# Patient Record
Sex: Male | Born: 1940 | Race: White | Hispanic: No | Marital: Married | Smoking: Former smoker
Health system: Southern US, Community
[De-identification: ages and names within clinical notes are randomized; demographics above are authoritative.]

---

## 1998-11-06 ENCOUNTER — Emergency Department (HOSPITAL_COMMUNITY): Admission: EM | Admit: 1998-11-06 | Discharge: 1998-11-07 | Payer: Self-pay | Admitting: Emergency Medicine

## 1998-11-07 ENCOUNTER — Encounter: Payer: Self-pay | Admitting: Emergency Medicine

## 1999-01-05 ENCOUNTER — Emergency Department (HOSPITAL_COMMUNITY): Admission: EM | Admit: 1999-01-05 | Discharge: 1999-01-05 | Payer: Self-pay | Admitting: Emergency Medicine

## 2005-04-22 ENCOUNTER — Emergency Department (HOSPITAL_COMMUNITY): Admission: EM | Admit: 2005-04-22 | Discharge: 2005-04-22 | Payer: Self-pay | Admitting: Emergency Medicine

## 2005-11-19 ENCOUNTER — Emergency Department (HOSPITAL_COMMUNITY): Admission: EM | Admit: 2005-11-19 | Discharge: 2005-11-19 | Payer: Self-pay | Admitting: Emergency Medicine

## 2007-01-28 IMAGING — CT CT HEAD W/O CM
1 series · 16 of 30 positions shown, 20 images · non-contrast
Comparison: none

HISTORY: Headache, fell off ladder striking right posterior head

[Series 2: head_seq 4.5 h45s st · axial · 0.43mm/px · z∈[+15,+159]mm · 16 of 36 slices shown, 20 images]
[im 2/36  brain]
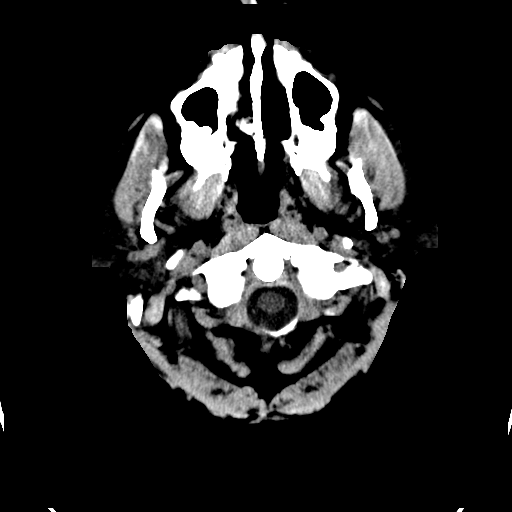
[im 2/36  bone]
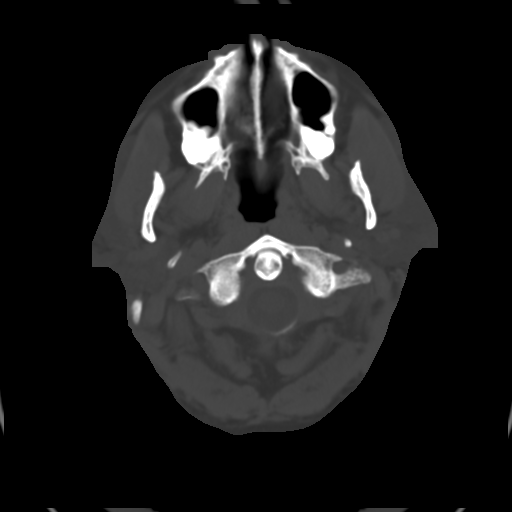
[im 4/36  brain]
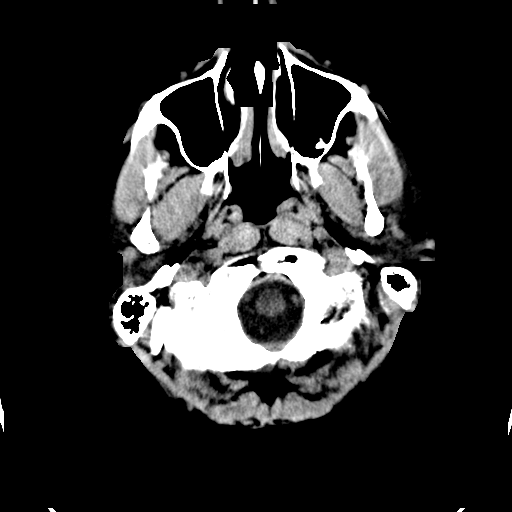
[im 7/36  brain]
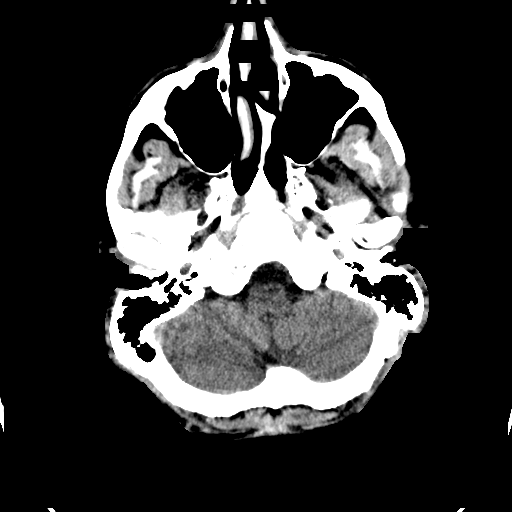
[im 9/36  brain]
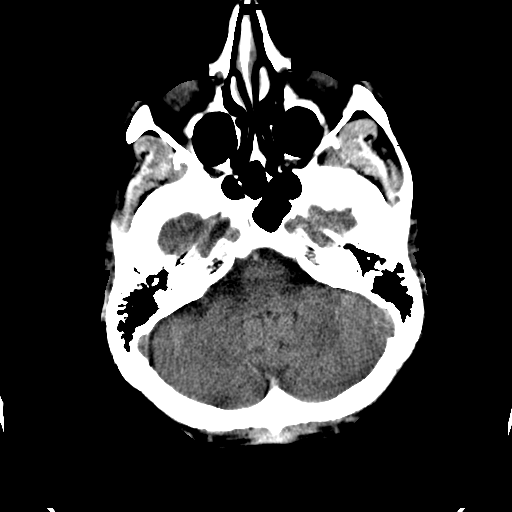
[im 10/36  brain]
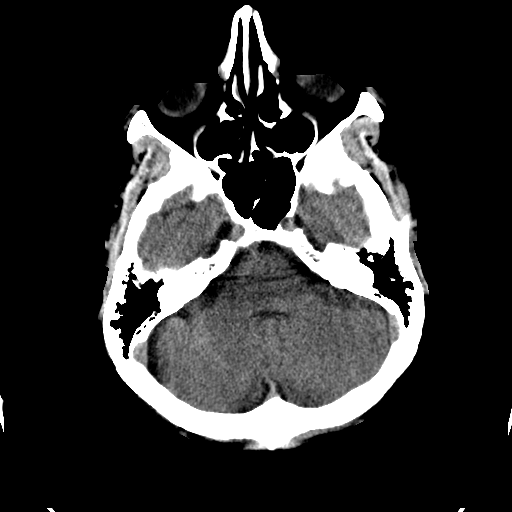
[im 10/36  bone]
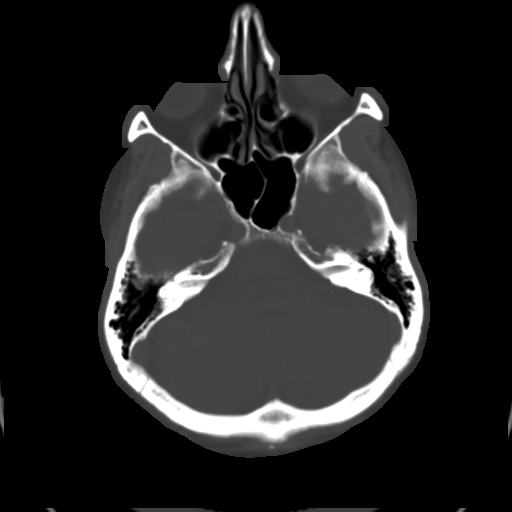
[im 13/36  brain]
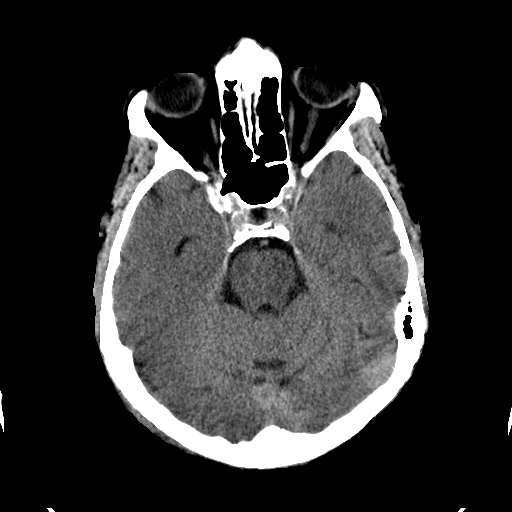
[im 15/36  brain]
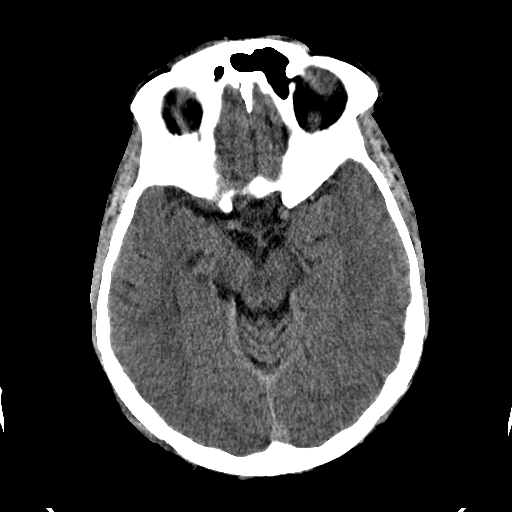
[im 17/36  brain]
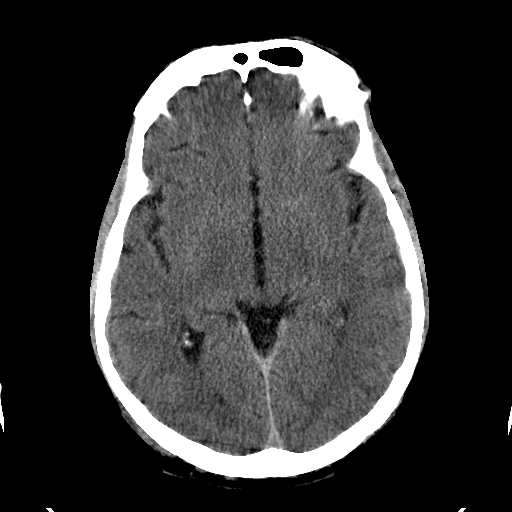
[im 19/36  brain]
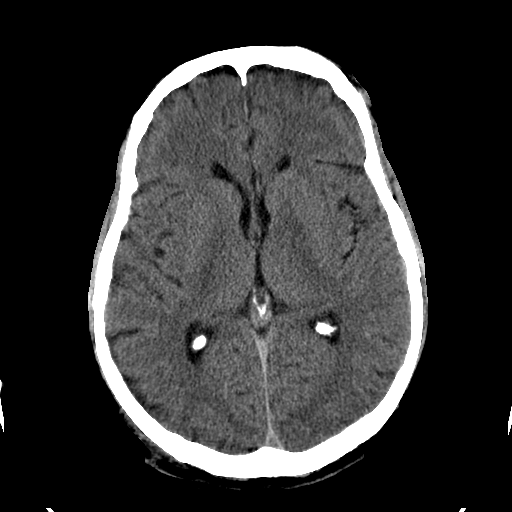
[im 19/36  bone]
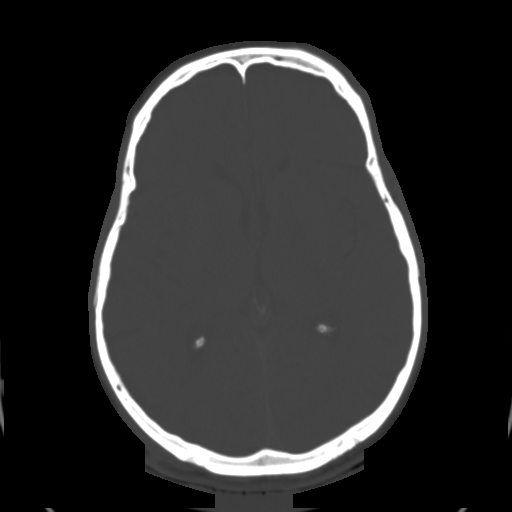
[im 21/36  brain]
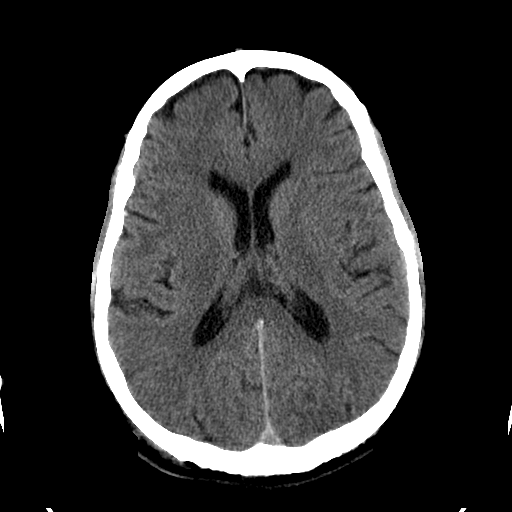
[im 23/36  brain]
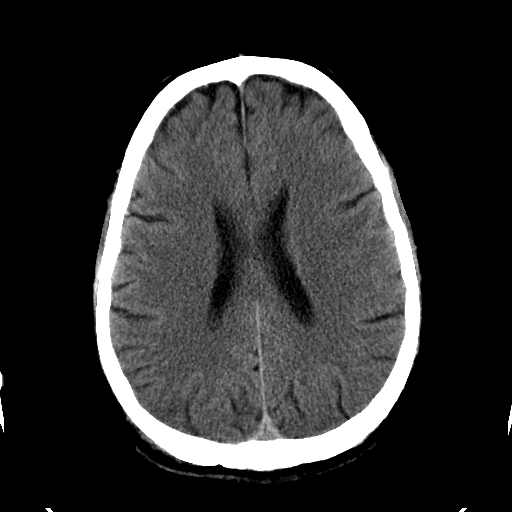
[im 26/36  brain]
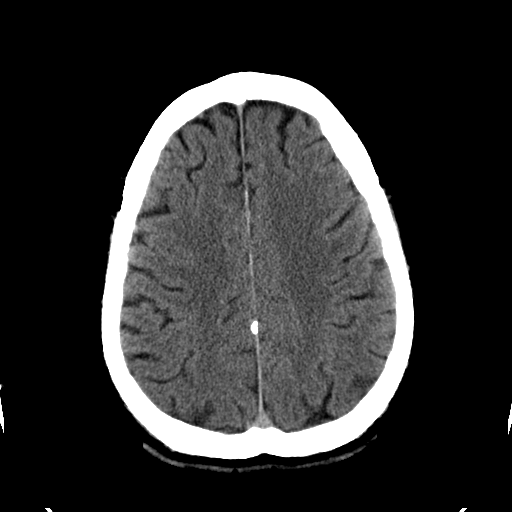
[im 27/36  brain]
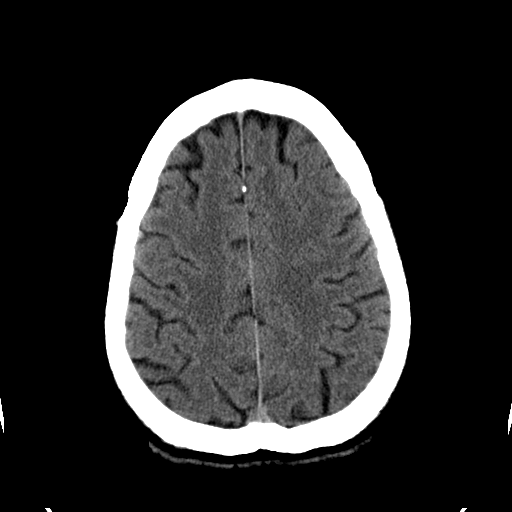
[im 27/36  bone]
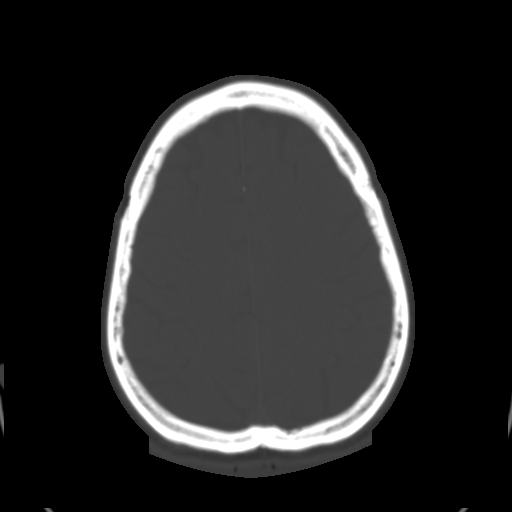
[im 29/36  brain]
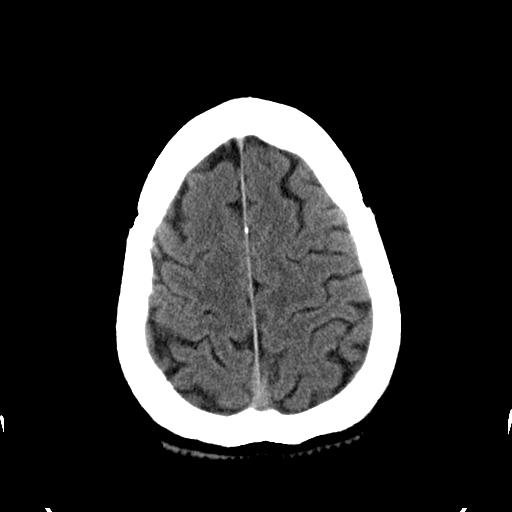
[im 32/36  brain]
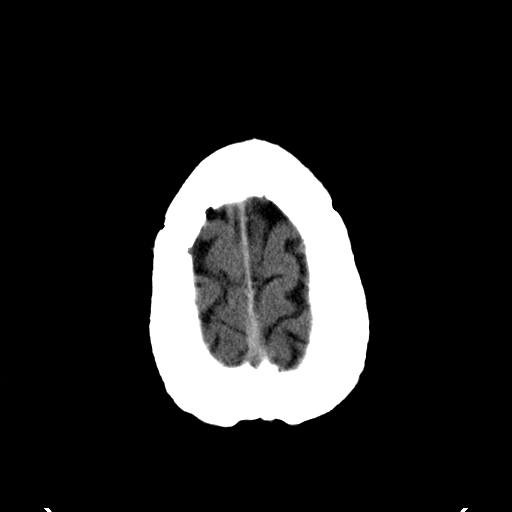
[im 34/36  brain]
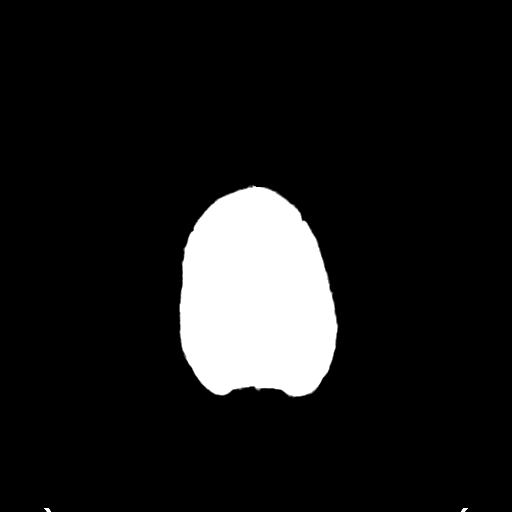

[16 of 30 positions shown; findings below may reference images not displayed]

CT HEAD WITHOUT CONTRAST:

Routine noncontrast CT without priors for comparison.

Mild atrophy.
Normal ventricular morphology.
No midline shift or mass-effect.
Normal appearance of brain parenchyma.
No intracranial hemorrhage, mass, or infarct.
No extra-axial fluid collection.
Small right temporal occipital scalp hematoma.
Calvarium intact.
Sinuses clear.
IMPRESSION: No acute intracranial abnormality.

## 2007-07-27 ENCOUNTER — Emergency Department (HOSPITAL_COMMUNITY): Admission: EM | Admit: 2007-07-27 | Discharge: 2007-07-27 | Payer: Self-pay | Admitting: Emergency Medicine

## 2011-12-11 DIAGNOSIS — Z79899 Other long term (current) drug therapy: Secondary | ICD-10-CM | POA: Diagnosis not present

## 2011-12-11 DIAGNOSIS — E7800 Pure hypercholesterolemia, unspecified: Secondary | ICD-10-CM | POA: Diagnosis not present

## 2012-04-29 DIAGNOSIS — E7800 Pure hypercholesterolemia, unspecified: Secondary | ICD-10-CM | POA: Diagnosis not present

## 2012-08-22 DIAGNOSIS — Z125 Encounter for screening for malignant neoplasm of prostate: Secondary | ICD-10-CM | POA: Diagnosis not present

## 2012-08-22 DIAGNOSIS — Z79899 Other long term (current) drug therapy: Secondary | ICD-10-CM | POA: Diagnosis not present

## 2012-08-22 DIAGNOSIS — E7800 Pure hypercholesterolemia, unspecified: Secondary | ICD-10-CM | POA: Diagnosis not present

## 2012-08-22 DIAGNOSIS — E559 Vitamin D deficiency, unspecified: Secondary | ICD-10-CM | POA: Diagnosis not present

## 2012-08-25 DIAGNOSIS — Z0000 Encounter for general adult medical examination without abnormal findings: Secondary | ICD-10-CM | POA: Diagnosis not present

## 2012-08-26 DIAGNOSIS — E785 Hyperlipidemia, unspecified: Secondary | ICD-10-CM | POA: Diagnosis not present

## 2012-08-26 DIAGNOSIS — Z1212 Encounter for screening for malignant neoplasm of rectum: Secondary | ICD-10-CM | POA: Diagnosis not present

## 2012-08-26 DIAGNOSIS — Z820 Family history of epilepsy and other diseases of the nervous system: Secondary | ICD-10-CM | POA: Diagnosis not present

## 2012-08-26 DIAGNOSIS — Z8249 Family history of ischemic heart disease and other diseases of the circulatory system: Secondary | ICD-10-CM | POA: Diagnosis not present

## 2012-08-26 DIAGNOSIS — H9190 Unspecified hearing loss, unspecified ear: Secondary | ICD-10-CM | POA: Diagnosis not present

## 2013-02-17 DIAGNOSIS — F411 Generalized anxiety disorder: Secondary | ICD-10-CM | POA: Diagnosis not present

## 2013-02-17 DIAGNOSIS — N529 Male erectile dysfunction, unspecified: Secondary | ICD-10-CM | POA: Diagnosis not present

## 2013-02-17 DIAGNOSIS — Z006 Encounter for examination for normal comparison and control in clinical research program: Secondary | ICD-10-CM | POA: Diagnosis not present

## 2013-02-17 DIAGNOSIS — E785 Hyperlipidemia, unspecified: Secondary | ICD-10-CM | POA: Diagnosis not present

## 2013-03-13 DIAGNOSIS — F520 Hypoactive sexual desire disorder: Secondary | ICD-10-CM | POA: Diagnosis not present

## 2013-03-30 DIAGNOSIS — E291 Testicular hypofunction: Secondary | ICD-10-CM | POA: Diagnosis not present

## 2013-04-06 ENCOUNTER — Encounter (HOSPITAL_COMMUNITY): Payer: Self-pay | Admitting: Emergency Medicine

## 2013-04-06 ENCOUNTER — Emergency Department (HOSPITAL_COMMUNITY)
Admission: EM | Admit: 2013-04-06 | Discharge: 2013-04-06 | Disposition: A | Discharge status: Home / Self Care | Payer: Medicare Other | Attending: Emergency Medicine | Admitting: Emergency Medicine

## 2013-04-06 DIAGNOSIS — S61209A Unspecified open wound of unspecified finger without damage to nail, initial encounter: Secondary | ICD-10-CM | POA: Insufficient documentation

## 2013-04-06 DIAGNOSIS — Z87891 Personal history of nicotine dependence: Secondary | ICD-10-CM | POA: Diagnosis not present

## 2013-04-06 DIAGNOSIS — W260XXA Contact with knife, initial encounter: Secondary | ICD-10-CM | POA: Insufficient documentation

## 2013-04-06 DIAGNOSIS — Y93G1 Activity, food preparation and clean up: Secondary | ICD-10-CM | POA: Insufficient documentation

## 2013-04-06 DIAGNOSIS — Y929 Unspecified place or not applicable: Secondary | ICD-10-CM | POA: Insufficient documentation

## 2013-04-06 DIAGNOSIS — S61219A Laceration without foreign body of unspecified finger without damage to nail, initial encounter: Secondary | ICD-10-CM

## 2013-04-06 DIAGNOSIS — Z79899 Other long term (current) drug therapy: Secondary | ICD-10-CM | POA: Insufficient documentation

## 2013-04-06 NOTE — ED Provider Notes (Signed)
CSN: [id number]     Arrival date & time 04/06/13  1951 History   First MD Initiated Contact with Patient 04/06/13 2110     This chart was scribed for Cordella Register by Ladona Ridgel Day, ED scribe. This patient was seen in room WTR9/WTR9 and the patient's care was started at 1951.  Chief Complaint  Patient presents with  . Finger Injury   HPI Comments: Patient has a laceration over the medial aspect of the left index, finger sustained while washing dishes.  Full range of motion.  Bleeding is controlled at this time.  Tetanus is up-to-date.  No numbness or tingling.  By report  The history is provided by the patient. No language interpreter was used.   HPI Comments: Daniel Christensen is a 72 y.o. male who presents to the Emergency Department complaining of laceration to medial aspect of left index finger over PIP joint 2 hours ago from cutting it on a knife while washing dishes this PM. He denies any other injuries. Bleeding controlled. He states last tetanus, 2 years ago.   History reviewed. No pertinent past medical history. History reviewed. No pertinent past surgical history. No family history on file. History  Substance Use Topics  . Smoking status: Former Smoker    Quit date: 03/23/2013  . Smokeless tobacco: Not on file  . Alcohol Use: Yes    Review of Systems  Constitutional: Negative for fever and chills.  Respiratory: Negative for shortness of breath.   Gastrointestinal: Negative for nausea and vomiting.  Skin: Positive for wound (laceration to left index finger).  Neurological: Negative for weakness and numbness.  All other systems reviewed and are negative.  A complete 10 system review of systems was obtained and all systems are negative except as noted in the HPI and PMH.    Allergies  Review of patient's allergies indicates no known allergies.  Home Medications   Current Outpatient Rx  Name  Route  Sig  Dispense  Refill  . atorvastatin (LIPITOR) 20 MG tablet    Oral   Take 20 mg by mouth daily.          Triage Vitals: BP 138/77  Pulse 66  Temp(Src) 98.2 F (36.8 C) (Oral)  Resp 20  Ht 5\' 9"  (1.753 m)  Wt 165 lb (74.844 kg)  BMI 24.36 kg/m2  SpO2 96% Physical Exam  Nursing note and vitals reviewed. Constitutional: He is oriented to person, place, and time. He appears well-developed and well-nourished. No distress.  HENT:  Head: Normocephalic and atraumatic.  Neck: Neck supple. No tracheal deviation present.  Cardiovascular: Normal rate.   Pulmonary/Chest: Effort normal. No respiratory distress.  Musculoskeletal: Normal range of motion. He exhibits no tenderness.  Neurological: He is alert and oriented to person, place, and time.  Skin: Skin is warm and dry.   laceration to the medial aspect of his left index finger throuat at the level of PIP joint  Psychiatric: He has a normal mood and affect. His behavior is normal.    ED Course  Procedures (including critical care time) DIAGNOSTIC STUDIES: Oxygen Saturation is 96% on room air, normal by my interpretation.    COORDINATION OF CARE: At 915 PM Discussed treatment plan with patient which includes laceration repair. Patient agrees.   LACERATION REPAIR Performed by: Sharen Hones Consent: Verbal consent obtained. Risks and benefits: risks, benefits and alternatives were discussed Patient identity confirmed: provided demographic data Time out performed prior to procedure Prepped and Draped in normal sterile fashion  Wound explored Laceration Location: medial aspect of left index finger through at the level of PIP Laceration Length: 1 cm No Foreign Bodies seen or palpated Anesthesia: local infiltration Local anesthetic: lidocaine 1% without epinephrine Anesthetic total: *1ml Irrigation method: syringe Amount of cleaning: standard Skin closure: 4-0 Prolene Number of sutures or staples: 4 Technique: simple interrupted  Patient tolerance: Patient tolerated the procedure well  with no immediate complications. Labs Review Labs Reviewed - No data to display Imaging Review No results found.  EKG Interpretation   None       MDM   1. Finger laceration, initial encounter    I personally performed the services described in this documentation, which was scribed in my presence. The recorded information has been reviewed and is accurate.     Arman Filter, NP 04/06/13 2140

## 2013-04-06 NOTE — ED Notes (Signed)
Pt reports cutting L index finger with a knife while washing dishes. Finger bandage with bleeding controlled. Pt presents with face mask on stating that he does not like germs.

## 2013-04-07 NOTE — ED Provider Notes (Signed)
Medical screening examination/treatment/procedure(s) were performed by non-physician practitioner and as supervising physician I was immediately available for consultation/collaboration.   Roney Marion, MD 04/07/13 [phone]

## 2013-04-23 DIAGNOSIS — S61209A Unspecified open wound of unspecified finger without damage to nail, initial encounter: Secondary | ICD-10-CM | POA: Diagnosis not present

## 2013-04-23 DIAGNOSIS — Z4802 Encounter for removal of sutures: Secondary | ICD-10-CM | POA: Diagnosis not present

## 2013-08-27 DIAGNOSIS — E559 Vitamin D deficiency, unspecified: Secondary | ICD-10-CM | POA: Diagnosis not present

## 2013-08-27 DIAGNOSIS — Z79899 Other long term (current) drug therapy: Secondary | ICD-10-CM | POA: Diagnosis not present

## 2013-08-27 DIAGNOSIS — Z0000 Encounter for general adult medical examination without abnormal findings: Secondary | ICD-10-CM | POA: Diagnosis not present

## 2013-08-27 DIAGNOSIS — Z125 Encounter for screening for malignant neoplasm of prostate: Secondary | ICD-10-CM | POA: Diagnosis not present

## 2013-08-27 DIAGNOSIS — E7800 Pure hypercholesterolemia, unspecified: Secondary | ICD-10-CM | POA: Diagnosis not present

## 2013-09-02 DIAGNOSIS — N529 Male erectile dysfunction, unspecified: Secondary | ICD-10-CM | POA: Diagnosis not present

## 2013-09-02 DIAGNOSIS — Z1212 Encounter for screening for malignant neoplasm of rectum: Secondary | ICD-10-CM | POA: Diagnosis not present

## 2013-09-02 DIAGNOSIS — E785 Hyperlipidemia, unspecified: Secondary | ICD-10-CM | POA: Diagnosis not present

## 2013-09-02 DIAGNOSIS — E7800 Pure hypercholesterolemia, unspecified: Secondary | ICD-10-CM | POA: Diagnosis not present

## 2013-09-02 DIAGNOSIS — H9190 Unspecified hearing loss, unspecified ear: Secondary | ICD-10-CM | POA: Diagnosis not present

## 2014-04-08 DIAGNOSIS — H02409 Unspecified ptosis of unspecified eyelid: Secondary | ICD-10-CM | POA: Diagnosis not present

## 2014-04-13 DIAGNOSIS — H02834 Dermatochalasis of left upper eyelid: Secondary | ICD-10-CM | POA: Diagnosis not present

## 2014-04-13 DIAGNOSIS — H02831 Dermatochalasis of right upper eyelid: Secondary | ICD-10-CM | POA: Diagnosis not present

## 2014-04-29 DIAGNOSIS — H02834 Dermatochalasis of left upper eyelid: Secondary | ICD-10-CM | POA: Diagnosis not present

## 2014-04-29 DIAGNOSIS — H02831 Dermatochalasis of right upper eyelid: Secondary | ICD-10-CM | POA: Diagnosis not present

## 2014-08-12 DIAGNOSIS — J069 Acute upper respiratory infection, unspecified: Secondary | ICD-10-CM | POA: Diagnosis not present

## 2014-09-29 DIAGNOSIS — Z0000 Encounter for general adult medical examination without abnormal findings: Secondary | ICD-10-CM | POA: Diagnosis not present

## 2014-10-01 DIAGNOSIS — Z1283 Encounter for screening for malignant neoplasm of skin: Secondary | ICD-10-CM | POA: Diagnosis not present

## 2014-10-01 DIAGNOSIS — X32XXXD Exposure to sunlight, subsequent encounter: Secondary | ICD-10-CM | POA: Diagnosis not present

## 2014-10-01 DIAGNOSIS — L570 Actinic keratosis: Secondary | ICD-10-CM | POA: Diagnosis not present

## 2014-10-04 DIAGNOSIS — N529 Male erectile dysfunction, unspecified: Secondary | ICD-10-CM | POA: Diagnosis not present

## 2014-10-04 DIAGNOSIS — E291 Testicular hypofunction: Secondary | ICD-10-CM | POA: Diagnosis not present

## 2014-10-04 DIAGNOSIS — E780 Pure hypercholesterolemia: Secondary | ICD-10-CM | POA: Diagnosis not present

## 2014-10-04 DIAGNOSIS — H9190 Unspecified hearing loss, unspecified ear: Secondary | ICD-10-CM | POA: Diagnosis not present

## 2014-10-05 DIAGNOSIS — E780 Pure hypercholesterolemia: Secondary | ICD-10-CM | POA: Diagnosis not present

## 2014-10-05 DIAGNOSIS — Z125 Encounter for screening for malignant neoplasm of prostate: Secondary | ICD-10-CM | POA: Diagnosis not present

## 2014-10-05 DIAGNOSIS — Z79899 Other long term (current) drug therapy: Secondary | ICD-10-CM | POA: Diagnosis not present

## 2014-11-10 DIAGNOSIS — D120 Benign neoplasm of cecum: Secondary | ICD-10-CM | POA: Diagnosis not present

## 2014-11-10 DIAGNOSIS — D122 Benign neoplasm of ascending colon: Secondary | ICD-10-CM | POA: Diagnosis not present

## 2014-11-10 DIAGNOSIS — Z1211 Encounter for screening for malignant neoplasm of colon: Secondary | ICD-10-CM | POA: Diagnosis not present

## 2014-11-10 DIAGNOSIS — D123 Benign neoplasm of transverse colon: Secondary | ICD-10-CM | POA: Diagnosis not present

## 2014-11-10 DIAGNOSIS — K621 Rectal polyp: Secondary | ICD-10-CM | POA: Diagnosis not present

## 2014-11-10 DIAGNOSIS — K5520 Angiodysplasia of colon without hemorrhage: Secondary | ICD-10-CM | POA: Diagnosis not present

## 2014-11-10 DIAGNOSIS — K5730 Diverticulosis of large intestine without perforation or abscess without bleeding: Secondary | ICD-10-CM | POA: Diagnosis not present

## 2014-11-10 DIAGNOSIS — D126 Benign neoplasm of colon, unspecified: Secondary | ICD-10-CM | POA: Diagnosis not present

## 2015-03-11 ENCOUNTER — Ambulatory Visit (INDEPENDENT_AMBULATORY_CARE_PROVIDER_SITE_OTHER): Payer: Medicare Other

## 2015-03-11 ENCOUNTER — Ambulatory Visit (INDEPENDENT_AMBULATORY_CARE_PROVIDER_SITE_OTHER): Payer: Medicare Other | Admitting: Podiatry

## 2015-03-11 ENCOUNTER — Encounter: Payer: Self-pay | Admitting: Podiatry

## 2015-03-11 VITALS — BP 147/84 | HR 66 | Resp 16

## 2015-03-11 DIAGNOSIS — M205X2 Other deformities of toe(s) (acquired), left foot: Secondary | ICD-10-CM | POA: Diagnosis not present

## 2015-03-11 DIAGNOSIS — M779 Enthesopathy, unspecified: Secondary | ICD-10-CM

## 2015-03-11 DIAGNOSIS — M2040 Other hammer toe(s) (acquired), unspecified foot: Secondary | ICD-10-CM

## 2015-03-11 MED ORDER — TRIAMCINOLONE ACETONIDE 10 MG/ML IJ SUSP
10.0000 mg | Freq: Once | INTRAMUSCULAR | Status: AC
  Administered 2015-03-11: 10 mg

## 2015-03-11 NOTE — Progress Notes (Signed)
   Subjective:    Patient ID: Daniel Christensen, male    DOB: [birth year], 74 y.o.   MRN: [id number]  HPI Comments: "I just want a foot check up"  Patient presents with: Toe Pain: 2nd toe right - tender for few months, callused area medial side, used a medicated callus pad and it is now better. Foot Pain: 5th MPJ left - aching for few weeks, redness and swelling, usually painful with certain shoes.    Toe Pain   Foot Pain      Review of Systems  All other systems reviewed and are negative.      Objective:   Physical Exam        Assessment & Plan:

## 2015-03-13 NOTE — Progress Notes (Signed)
Subjective:     Patient ID: Daniel Christensen, male   DOB: [birth year], 74 y.o.   MRN: [id number]  HPI patient presents stating I been getting discomfort between my big toe second toe right and I have redness and pain around the outside of my left foot wear certain shoes seem to bother. It's been going on for several months   Review of Systems  All other systems reviewed and are negative.      Objective:   Physical Exam  Constitutional: He is oriented to person, place, and time.  Cardiovascular: Intact distal pulses.   Musculoskeletal: Normal range of motion.  Neurological: He is oriented to person, place, and time.  Skin: Skin is warm.  Nursing note and vitals reviewed.  neurovascular status found to be intact with muscle strength adequate range of motion within normal limits. I noted patient to have inflammation and pain around the second toe right at the interphalangeal joint and fluid buildup with redness around the left fifth metatarsal with pain when palpated. Patient states that does not remember specific injury but that certain shoes definitely cause more trouble     Assessment:     Inflammatory changes between the big toe second toe right most of the brought on by structural malalignment of the hallux against the second toe with enlargement of the fifth metatarsal head left with fluid buildup consistent with inflammatory capsulitis    Plan:     H&P and x-rays reviewed with patient. For the right foot I recommended padding and applied pad to take pressure off the joint surface and for the left I did a small capsular injection 3 mg Kenalog 5 mg Xylocaine advised on soaks and wider-type shoes. Reappoint if symptoms persist and may have to consider surgical intervention

## 2015-04-06 DIAGNOSIS — Z23 Encounter for immunization: Secondary | ICD-10-CM | POA: Diagnosis not present

## 2015-04-13 DIAGNOSIS — M810 Age-related osteoporosis without current pathological fracture: Secondary | ICD-10-CM | POA: Diagnosis not present

## 2015-05-03 DIAGNOSIS — M810 Age-related osteoporosis without current pathological fracture: Secondary | ICD-10-CM | POA: Diagnosis not present

## 2015-05-27 ENCOUNTER — Telehealth: Payer: Self-pay | Admitting: Acute Care

## 2015-05-27 NOTE — Telephone Encounter (Signed)
I have called Mr. Daniel Christensen back, at his request, to try to and get an accurate smoking history and, if he meets criteria, get his appointment scheduled per referral from Dr. Shelia Media . Again today, he did not have time to talk with me, but told me he would call me back next week.  ( 05/30/15-06/03/15). We will await his return call, and if he does not return the call, we will send him a letter to call and make an appointment when it is convenient for him.

## 2015-06-13 ENCOUNTER — Telehealth: Payer: Self-pay | Admitting: Acute Care

## 2015-06-13 NOTE — Telephone Encounter (Signed)
This patient has been called multiple times, stating that he is too busy to schedule an appointment, asking [place] to call again. I have called again today. His wife answered the phone. She placed me on hold and never returned to the phone. After 5 minutes I hung up. We will send a letter to both the patient and Dr. Shelia Media. We will ask the patient to call us to schedule an appointment if he desires a scan, and we will let Dr. Shelia Media know that the patient would never schedule an appointment after more than 4 attempts on our part.

## 2015-08-02 DIAGNOSIS — H52203 Unspecified astigmatism, bilateral: Secondary | ICD-10-CM | POA: Diagnosis not present

## 2015-08-02 DIAGNOSIS — H25813 Combined forms of age-related cataract, bilateral: Secondary | ICD-10-CM | POA: Diagnosis not present

## 2015-08-02 DIAGNOSIS — H524 Presbyopia: Secondary | ICD-10-CM | POA: Diagnosis not present

## 2015-08-02 DIAGNOSIS — H5203 Hypermetropia, bilateral: Secondary | ICD-10-CM | POA: Diagnosis not present

## 2016-05-04 DIAGNOSIS — Z23 Encounter for immunization: Secondary | ICD-10-CM | POA: Diagnosis not present

## 2016-10-30 DIAGNOSIS — N529 Male erectile dysfunction, unspecified: Secondary | ICD-10-CM | POA: Diagnosis not present

## 2016-10-30 DIAGNOSIS — Z125 Encounter for screening for malignant neoplasm of prostate: Secondary | ICD-10-CM | POA: Diagnosis not present

## 2016-10-30 DIAGNOSIS — E291 Testicular hypofunction: Secondary | ICD-10-CM | POA: Diagnosis not present

## 2016-10-30 DIAGNOSIS — Z86010 Personal history of colonic polyps: Secondary | ICD-10-CM | POA: Diagnosis not present

## 2016-10-30 DIAGNOSIS — E7800 Pure hypercholesterolemia, unspecified: Secondary | ICD-10-CM | POA: Diagnosis not present

## 2016-10-30 DIAGNOSIS — H6123 Impacted cerumen, bilateral: Secondary | ICD-10-CM | POA: Diagnosis not present

## 2016-11-01 DIAGNOSIS — E785 Hyperlipidemia, unspecified: Secondary | ICD-10-CM | POA: Diagnosis not present

## 2016-11-01 DIAGNOSIS — R972 Elevated prostate specific antigen [PSA]: Secondary | ICD-10-CM | POA: Diagnosis not present

## 2016-11-01 DIAGNOSIS — I10 Essential (primary) hypertension: Secondary | ICD-10-CM | POA: Diagnosis not present

## 2016-11-12 DIAGNOSIS — R972 Elevated prostate specific antigen [PSA]: Secondary | ICD-10-CM | POA: Diagnosis not present

## 2016-12-06 DIAGNOSIS — R972 Elevated prostate specific antigen [PSA]: Secondary | ICD-10-CM | POA: Diagnosis not present

## 2016-12-10 DIAGNOSIS — R972 Elevated prostate specific antigen [PSA]: Secondary | ICD-10-CM | POA: Diagnosis not present

## 2017-01-02 DIAGNOSIS — C61 Malignant neoplasm of prostate: Secondary | ICD-10-CM | POA: Diagnosis not present

## 2017-01-02 DIAGNOSIS — Z87891 Personal history of nicotine dependence: Secondary | ICD-10-CM | POA: Diagnosis not present

## 2017-01-02 DIAGNOSIS — R972 Elevated prostate specific antigen [PSA]: Secondary | ICD-10-CM | POA: Diagnosis not present

## 2017-01-04 DIAGNOSIS — I639 Cerebral infarction, unspecified: Secondary | ICD-10-CM | POA: Diagnosis not present

## 2017-01-04 DIAGNOSIS — F801 Expressive language disorder: Secondary | ICD-10-CM | POA: Diagnosis not present

## 2017-01-04 DIAGNOSIS — I6522 Occlusion and stenosis of left carotid artery: Secondary | ICD-10-CM | POA: Diagnosis not present

## 2017-01-04 DIAGNOSIS — I63412 Cerebral infarction due to embolism of left middle cerebral artery: Secondary | ICD-10-CM | POA: Diagnosis not present

## 2017-01-04 DIAGNOSIS — R319 Hematuria, unspecified: Secondary | ICD-10-CM | POA: Diagnosis not present

## 2017-01-04 DIAGNOSIS — I6529 Occlusion and stenosis of unspecified carotid artery: Secondary | ICD-10-CM | POA: Diagnosis not present

## 2017-01-04 DIAGNOSIS — G459 Transient cerebral ischemic attack, unspecified: Secondary | ICD-10-CM | POA: Diagnosis not present

## 2017-01-04 DIAGNOSIS — R972 Elevated prostate specific antigen [PSA]: Secondary | ICD-10-CM | POA: Diagnosis not present

## 2017-01-04 DIAGNOSIS — C61 Malignant neoplasm of prostate: Secondary | ICD-10-CM | POA: Diagnosis present

## 2017-01-04 DIAGNOSIS — I10 Essential (primary) hypertension: Secondary | ICD-10-CM | POA: Diagnosis not present

## 2017-01-04 DIAGNOSIS — I63512 Cerebral infarction due to unspecified occlusion or stenosis of left middle cerebral artery: Secondary | ICD-10-CM | POA: Diagnosis not present

## 2017-01-04 DIAGNOSIS — R531 Weakness: Secondary | ICD-10-CM | POA: Diagnosis not present

## 2017-01-04 DIAGNOSIS — R29705 NIHSS score 5: Secondary | ICD-10-CM | POA: Diagnosis present

## 2017-01-04 DIAGNOSIS — G319 Degenerative disease of nervous system, unspecified: Secondary | ICD-10-CM | POA: Diagnosis not present

## 2017-01-04 DIAGNOSIS — I6523 Occlusion and stenosis of bilateral carotid arteries: Secondary | ICD-10-CM | POA: Diagnosis not present

## 2017-01-04 DIAGNOSIS — Z01818 Encounter for other preprocedural examination: Secondary | ICD-10-CM | POA: Diagnosis not present

## 2017-01-04 DIAGNOSIS — I771 Stricture of artery: Secondary | ICD-10-CM | POA: Diagnosis not present

## 2017-01-04 DIAGNOSIS — I6521 Occlusion and stenosis of right carotid artery: Secondary | ICD-10-CM | POA: Diagnosis not present

## 2017-01-09 DIAGNOSIS — I639 Cerebral infarction, unspecified: Secondary | ICD-10-CM | POA: Diagnosis not present

## 2017-01-11 DIAGNOSIS — I69328 Other speech and language deficits following cerebral infarction: Secondary | ICD-10-CM | POA: Diagnosis not present

## 2017-01-11 DIAGNOSIS — I63512 Cerebral infarction due to unspecified occlusion or stenosis of left middle cerebral artery: Secondary | ICD-10-CM | POA: Diagnosis not present

## 2017-01-11 DIAGNOSIS — I639 Cerebral infarction, unspecified: Secondary | ICD-10-CM | POA: Diagnosis not present

## 2017-01-15 DIAGNOSIS — I63512 Cerebral infarction due to unspecified occlusion or stenosis of left middle cerebral artery: Secondary | ICD-10-CM | POA: Diagnosis not present

## 2017-01-15 DIAGNOSIS — I639 Cerebral infarction, unspecified: Secondary | ICD-10-CM | POA: Diagnosis not present

## 2017-01-15 DIAGNOSIS — I69328 Other speech and language deficits following cerebral infarction: Secondary | ICD-10-CM | POA: Diagnosis not present

## 2017-01-17 DIAGNOSIS — C61 Malignant neoplasm of prostate: Secondary | ICD-10-CM | POA: Diagnosis not present

## 2017-01-21 DIAGNOSIS — I6529 Occlusion and stenosis of unspecified carotid artery: Secondary | ICD-10-CM | POA: Diagnosis not present

## 2017-01-24 DIAGNOSIS — C61 Malignant neoplasm of prostate: Secondary | ICD-10-CM | POA: Diagnosis not present

## 2017-01-25 DIAGNOSIS — I639 Cerebral infarction, unspecified: Secondary | ICD-10-CM | POA: Diagnosis not present

## 2017-01-25 DIAGNOSIS — I63512 Cerebral infarction due to unspecified occlusion or stenosis of left middle cerebral artery: Secondary | ICD-10-CM | POA: Diagnosis not present

## 2017-01-25 DIAGNOSIS — I69328 Other speech and language deficits following cerebral infarction: Secondary | ICD-10-CM | POA: Diagnosis not present

## 2017-01-29 DIAGNOSIS — C61 Malignant neoplasm of prostate: Secondary | ICD-10-CM | POA: Diagnosis not present

## 2017-02-07 DIAGNOSIS — C61 Malignant neoplasm of prostate: Secondary | ICD-10-CM | POA: Diagnosis not present

## 2017-02-08 DIAGNOSIS — I10 Essential (primary) hypertension: Secondary | ICD-10-CM | POA: Diagnosis not present

## 2017-02-08 DIAGNOSIS — C61 Malignant neoplasm of prostate: Secondary | ICD-10-CM | POA: Diagnosis not present

## 2017-02-08 DIAGNOSIS — E785 Hyperlipidemia, unspecified: Secondary | ICD-10-CM | POA: Diagnosis not present

## 2017-03-04 DIAGNOSIS — I6529 Occlusion and stenosis of unspecified carotid artery: Secondary | ICD-10-CM | POA: Diagnosis not present

## 2017-03-15 DIAGNOSIS — C61 Malignant neoplasm of prostate: Secondary | ICD-10-CM | POA: Diagnosis not present

## 2017-03-19 DIAGNOSIS — Z23 Encounter for immunization: Secondary | ICD-10-CM | POA: Diagnosis not present

## 2017-03-19 DIAGNOSIS — I10 Essential (primary) hypertension: Secondary | ICD-10-CM | POA: Diagnosis not present

## 2017-04-01 DIAGNOSIS — C61 Malignant neoplasm of prostate: Secondary | ICD-10-CM | POA: Diagnosis not present

## 2017-04-02 DIAGNOSIS — C61 Malignant neoplasm of prostate: Secondary | ICD-10-CM | POA: Diagnosis not present

## 2017-04-03 DIAGNOSIS — C61 Malignant neoplasm of prostate: Secondary | ICD-10-CM | POA: Diagnosis not present

## 2017-04-04 DIAGNOSIS — C61 Malignant neoplasm of prostate: Secondary | ICD-10-CM | POA: Diagnosis not present

## 2017-04-08 DIAGNOSIS — I10 Essential (primary) hypertension: Secondary | ICD-10-CM | POA: Diagnosis not present

## 2017-04-09 DIAGNOSIS — C61 Malignant neoplasm of prostate: Secondary | ICD-10-CM | POA: Diagnosis not present

## 2017-04-09 DIAGNOSIS — D485 Neoplasm of uncertain behavior of skin: Secondary | ICD-10-CM | POA: Diagnosis not present

## 2017-04-09 DIAGNOSIS — L570 Actinic keratosis: Secondary | ICD-10-CM | POA: Diagnosis not present

## 2017-04-09 DIAGNOSIS — D1801 Hemangioma of skin and subcutaneous tissue: Secondary | ICD-10-CM | POA: Diagnosis not present

## 2017-04-09 DIAGNOSIS — D225 Melanocytic nevi of trunk: Secondary | ICD-10-CM | POA: Diagnosis not present

## 2017-04-09 DIAGNOSIS — L821 Other seborrheic keratosis: Secondary | ICD-10-CM | POA: Diagnosis not present

## 2017-04-10 DIAGNOSIS — C61 Malignant neoplasm of prostate: Secondary | ICD-10-CM | POA: Diagnosis not present

## 2017-04-11 DIAGNOSIS — E875 Hyperkalemia: Secondary | ICD-10-CM | POA: Diagnosis not present

## 2017-04-11 DIAGNOSIS — I10 Essential (primary) hypertension: Secondary | ICD-10-CM | POA: Diagnosis not present

## 2017-04-11 DIAGNOSIS — C61 Malignant neoplasm of prostate: Secondary | ICD-10-CM | POA: Diagnosis not present

## 2017-04-11 DIAGNOSIS — E785 Hyperlipidemia, unspecified: Secondary | ICD-10-CM | POA: Diagnosis not present

## 2017-04-12 DIAGNOSIS — C61 Malignant neoplasm of prostate: Secondary | ICD-10-CM | POA: Diagnosis not present

## 2017-04-15 DIAGNOSIS — C61 Malignant neoplasm of prostate: Secondary | ICD-10-CM | POA: Diagnosis not present

## 2017-04-16 DIAGNOSIS — C61 Malignant neoplasm of prostate: Secondary | ICD-10-CM | POA: Diagnosis not present

## 2017-04-17 DIAGNOSIS — C61 Malignant neoplasm of prostate: Secondary | ICD-10-CM | POA: Diagnosis not present

## 2017-04-18 DIAGNOSIS — C61 Malignant neoplasm of prostate: Secondary | ICD-10-CM | POA: Diagnosis not present

## 2017-04-19 DIAGNOSIS — C61 Malignant neoplasm of prostate: Secondary | ICD-10-CM | POA: Diagnosis not present

## 2017-04-22 DIAGNOSIS — C61 Malignant neoplasm of prostate: Secondary | ICD-10-CM | POA: Diagnosis not present

## 2017-04-23 DIAGNOSIS — C61 Malignant neoplasm of prostate: Secondary | ICD-10-CM | POA: Diagnosis not present

## 2017-04-24 DIAGNOSIS — C61 Malignant neoplasm of prostate: Secondary | ICD-10-CM | POA: Diagnosis not present

## 2017-04-25 DIAGNOSIS — C61 Malignant neoplasm of prostate: Secondary | ICD-10-CM | POA: Diagnosis not present

## 2017-04-26 DIAGNOSIS — C61 Malignant neoplasm of prostate: Secondary | ICD-10-CM | POA: Diagnosis not present

## 2017-04-29 DIAGNOSIS — C61 Malignant neoplasm of prostate: Secondary | ICD-10-CM | POA: Diagnosis not present

## 2017-04-30 DIAGNOSIS — C61 Malignant neoplasm of prostate: Secondary | ICD-10-CM | POA: Diagnosis not present

## 2017-05-01 DIAGNOSIS — C61 Malignant neoplasm of prostate: Secondary | ICD-10-CM | POA: Diagnosis not present

## 2017-05-02 DIAGNOSIS — C61 Malignant neoplasm of prostate: Secondary | ICD-10-CM | POA: Diagnosis not present

## 2017-05-03 DIAGNOSIS — C61 Malignant neoplasm of prostate: Secondary | ICD-10-CM | POA: Diagnosis not present

## 2017-05-06 DIAGNOSIS — C61 Malignant neoplasm of prostate: Secondary | ICD-10-CM | POA: Diagnosis not present

## 2017-05-07 DIAGNOSIS — C61 Malignant neoplasm of prostate: Secondary | ICD-10-CM | POA: Diagnosis not present

## 2017-05-08 DIAGNOSIS — C61 Malignant neoplasm of prostate: Secondary | ICD-10-CM | POA: Diagnosis not present

## 2017-05-08 DIAGNOSIS — D485 Neoplasm of uncertain behavior of skin: Secondary | ICD-10-CM | POA: Diagnosis not present

## 2017-05-08 DIAGNOSIS — D044 Carcinoma in situ of skin of scalp and neck: Secondary | ICD-10-CM | POA: Diagnosis not present

## 2017-05-09 DIAGNOSIS — C61 Malignant neoplasm of prostate: Secondary | ICD-10-CM | POA: Diagnosis not present

## 2017-05-10 DIAGNOSIS — C61 Malignant neoplasm of prostate: Secondary | ICD-10-CM | POA: Diagnosis not present

## 2017-05-13 DIAGNOSIS — C61 Malignant neoplasm of prostate: Secondary | ICD-10-CM | POA: Diagnosis not present

## 2017-05-14 DIAGNOSIS — C61 Malignant neoplasm of prostate: Secondary | ICD-10-CM | POA: Diagnosis not present

## 2017-05-15 DIAGNOSIS — C61 Malignant neoplasm of prostate: Secondary | ICD-10-CM | POA: Diagnosis not present

## 2017-05-20 DIAGNOSIS — C61 Malignant neoplasm of prostate: Secondary | ICD-10-CM | POA: Diagnosis not present

## 2017-05-21 DIAGNOSIS — C61 Malignant neoplasm of prostate: Secondary | ICD-10-CM | POA: Diagnosis not present

## 2017-05-22 DIAGNOSIS — C61 Malignant neoplasm of prostate: Secondary | ICD-10-CM | POA: Diagnosis not present

## 2017-05-23 DIAGNOSIS — C61 Malignant neoplasm of prostate: Secondary | ICD-10-CM | POA: Diagnosis not present

## 2017-05-24 DIAGNOSIS — C61 Malignant neoplasm of prostate: Secondary | ICD-10-CM | POA: Diagnosis not present

## 2017-05-27 DIAGNOSIS — C61 Malignant neoplasm of prostate: Secondary | ICD-10-CM | POA: Diagnosis not present

## 2017-05-28 DIAGNOSIS — C61 Malignant neoplasm of prostate: Secondary | ICD-10-CM | POA: Diagnosis not present

## 2017-05-29 DIAGNOSIS — C61 Malignant neoplasm of prostate: Secondary | ICD-10-CM | POA: Diagnosis not present

## 2017-05-30 DIAGNOSIS — C61 Malignant neoplasm of prostate: Secondary | ICD-10-CM | POA: Diagnosis not present

## 2017-05-31 DIAGNOSIS — C61 Malignant neoplasm of prostate: Secondary | ICD-10-CM | POA: Diagnosis not present

## 2017-06-03 DIAGNOSIS — C61 Malignant neoplasm of prostate: Secondary | ICD-10-CM | POA: Diagnosis not present

## 2017-06-07 DIAGNOSIS — C61 Malignant neoplasm of prostate: Secondary | ICD-10-CM | POA: Diagnosis not present

## 2017-06-14 DIAGNOSIS — C61 Malignant neoplasm of prostate: Secondary | ICD-10-CM | POA: Diagnosis not present
